# Patient Record
Sex: Male | Born: 1980 | Race: White | Hispanic: No | Marital: Married | Smoking: Former smoker
Health system: Southern US, Community
[De-identification: ages and names within clinical notes are randomized; demographics above are authoritative.]

---

## 2004-03-13 ENCOUNTER — Emergency Department (HOSPITAL_COMMUNITY): Admission: EM | Admit: 2004-03-13 | Discharge: 2004-03-13 | Payer: Self-pay

## 2008-08-12 ENCOUNTER — Emergency Department (HOSPITAL_COMMUNITY): Admission: EM | Admit: 2008-08-12 | Discharge: 2008-08-12 | Payer: Self-pay | Admitting: Family Medicine

## 2008-11-14 ENCOUNTER — Emergency Department (HOSPITAL_COMMUNITY): Admission: EM | Admit: 2008-11-14 | Discharge: 2008-11-14 | Payer: Self-pay | Admitting: Emergency Medicine

## 2010-02-13 ENCOUNTER — Emergency Department (HOSPITAL_COMMUNITY): Admission: EM | Admit: 2010-02-13 | Discharge: 2010-02-13 | Payer: Self-pay | Admitting: Family Medicine

## 2014-06-20 ENCOUNTER — Ambulatory Visit (INDEPENDENT_AMBULATORY_CARE_PROVIDER_SITE_OTHER): Payer: PRIVATE HEALTH INSURANCE | Admitting: Internal Medicine

## 2014-06-20 VITALS — BP 100/64 | HR 59 | Temp 98.3°F | Resp 16 | Ht 77.0 in | Wt 197.4 lb

## 2014-06-20 DIAGNOSIS — N528 Other male erectile dysfunction: Secondary | ICD-10-CM

## 2014-06-20 DIAGNOSIS — N529 Male erectile dysfunction, unspecified: Secondary | ICD-10-CM

## 2014-06-20 MED ORDER — TADALAFIL 20 MG PO TABS: 10.0000 mg | ORAL_TABLET | ORAL | Status: AC | PRN

## 2014-06-20 NOTE — Progress Notes (Signed)
   Subjective:  This chart was scribed for Ellamae Siaobert Damean Poffenberger, MD by Bronson CurbJacqueline Melvin, ED Scribe. This patient was seen in room Room/bed 11 and the patient's care was started at 10:37 AM.     Patient ID: Thomas Hendrix, male    DOB: 31-Dec-1980, 33 y.o.   MRN: [id number]  HPI  HPI Comments: Thomas Hendrix is a 33 y.o. male who presents to the [facility] for a Cialis or Viagra prescription. He states he and his wife are trying to conceive, and is concerned for his performance.50% time =no trouble but now trying to get preg again and Needs to be sure he can perform. He is established with a PCP, but states he can not be seen for 2 months. Dr Renne CriglerPharr.Eval negative re testos and other inhibiting problems. He feels pressure to accomplish. Patient has one child (33 years old).   No med probs No meds  PCP: Dr. Demetrius CharityP   Review of Systems  Constitutional: Negative for fever, chills, activity change, fatigue and unexpected weight change.  Respiratory: Negative for shortness of breath.   Cardiovascular: Negative for chest pain and palpitations.  Genitourinary: Negative for frequency, decreased urine volume, scrotal swelling, difficulty urinating and testicular pain.  all others neg     Objective:   Physical Exam  Nursing note and vitals reviewed. Constitutional: He is oriented to person, place, and time. He appears well-developed and well-nourished. No distress.  HENT:  Head: Normocephalic and atraumatic.  Eyes: Conjunctivae and EOM are normal. Pupils are equal, round, and reactive to light.  Neck: Neck supple. No thyromegaly present.  Cardiovascular: Normal rate and regular rhythm.   Pulmonary/Chest: Effort normal. No respiratory distress.  Musculoskeletal: Normal range of motion.  Neurological: He is alert and oriented to person, place, and time.  Skin: Skin is warm and dry.  Psychiatric: He has a normal mood and affect. His behavior is normal. Thought content normal.            Assessment & Plan:    I have completed the patient encounter in its entirety as documented by the scribe, with editing by me where necessary. Sakeena Teall P. Merla Richesoolittle, M.D. ED--Overthinking cialis ok Meds ordered this encounter  Medications  . tadalafil (CIALIS) 20 MG tablet    Sig: Take 0.5-1 tablets (10-20 mg total) by mouth every other day as needed for erectile dysfunction.    Dispense:  8 tablet    Refill:  11   F/u pcp

## 2016-05-31 ENCOUNTER — Other Ambulatory Visit: Payer: Self-pay | Admitting: Internal Medicine

## 2016-05-31 ENCOUNTER — Ambulatory Visit
Admission: RE | Admit: 2016-05-31 | Discharge: 2016-05-31 | Disposition: A | Discharge status: Home / Self Care | Payer: 59 | Source: Ambulatory Visit | Attending: Internal Medicine | Admitting: Internal Medicine

## 2016-05-31 DIAGNOSIS — R748 Abnormal levels of other serum enzymes: Secondary | ICD-10-CM

## 2016-05-31 DIAGNOSIS — R7989 Other specified abnormal findings of blood chemistry: Secondary | ICD-10-CM

## 2016-05-31 DIAGNOSIS — R109 Unspecified abdominal pain: Secondary | ICD-10-CM

## 2016-05-31 DIAGNOSIS — R945 Abnormal results of liver function studies: Principal | ICD-10-CM

## 2016-06-01 ENCOUNTER — Other Ambulatory Visit: Payer: Self-pay | Admitting: Internal Medicine

## 2016-06-01 ENCOUNTER — Ambulatory Visit
Admission: RE | Admit: 2016-06-01 | Discharge: 2016-06-01 | Disposition: A | Discharge status: Home / Self Care | Payer: 59 | Source: Ambulatory Visit | Attending: Internal Medicine | Admitting: Internal Medicine

## 2016-06-01 DIAGNOSIS — R109 Unspecified abdominal pain: Secondary | ICD-10-CM

## 2016-06-01 DIAGNOSIS — R319 Hematuria, unspecified: Secondary | ICD-10-CM

## 2016-06-01 DIAGNOSIS — M545 Low back pain: Secondary | ICD-10-CM

## 2016-06-01 MED ORDER — IOPAMIDOL (ISOVUE-300) INJECTION 61%: 125.0000 mL | Freq: Once | INTRAVENOUS | Status: DC | PRN

## 2016-06-16 ENCOUNTER — Other Ambulatory Visit (HOSPITAL_COMMUNITY): Payer: Self-pay | Admitting: Gastroenterology

## 2016-06-16 DIAGNOSIS — B1920 Unspecified viral hepatitis C without hepatic coma: Secondary | ICD-10-CM

## 2016-08-03 ENCOUNTER — Ambulatory Visit (HOSPITAL_COMMUNITY)
Admission: RE | Admit: 2016-08-03 | Discharge: 2016-08-03 | Disposition: A | Discharge status: Home / Self Care | Payer: 59 | Source: Ambulatory Visit | Attending: Gastroenterology | Admitting: Gastroenterology

## 2016-08-03 DIAGNOSIS — B1920 Unspecified viral hepatitis C without hepatic coma: Secondary | ICD-10-CM | POA: Insufficient documentation

## 2017-08-20 IMAGING — US US ABDOMEN COMPLETE W/ ELASTOGRAPHY
1 series · 13 of 25 positions shown · non-contrast
Comparison: 06/01/2016 CT abdomen/ pelvis. 05/31/2016 right upper
quadrant abdominal sonogram.

CLINICAL DATA: Hepatitis-C.  Elevated liver function tests.

EXAM:
ABDOMEN ULTRASOUND COMPLETE
ULTRASOUND HEPATIC ELASTOGRAPHY
TECHNIQUE: Ultrasound elastography evaluation of the liver was performed. A
region of interest was placed in the right lobe of the liver.
Following application of a compressive sonographic pulse, shear
waves were detected in the adjacent hepatic tissue and the shear
wave velocity was calculated. Multiple assessments were performed at
the selected site. Median shear wave velocity is correlated to a
Metavir fibrosis score.

[Series 1: us abdomen complete w/ elastography · 0.27mm/px · 13 of 75 slices shown]
[im 1/75]
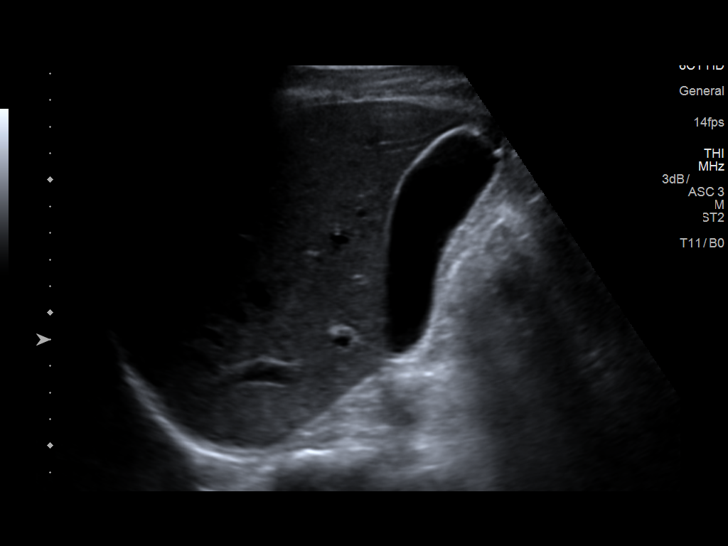
[im 7/75]
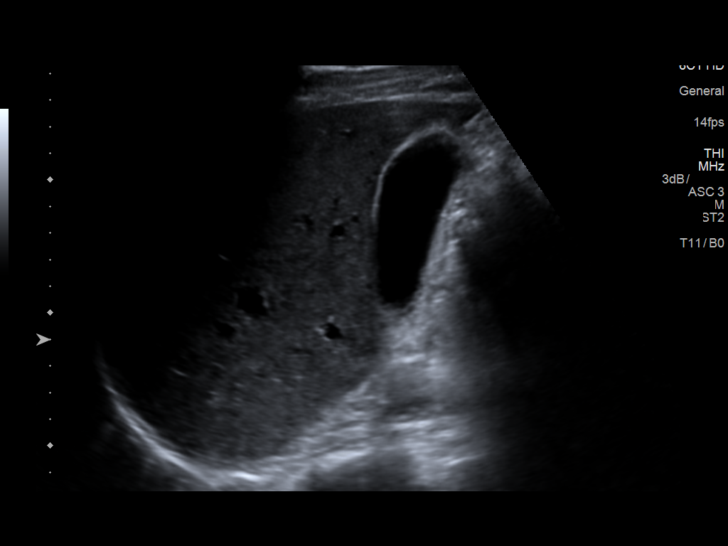
[im 13/75]
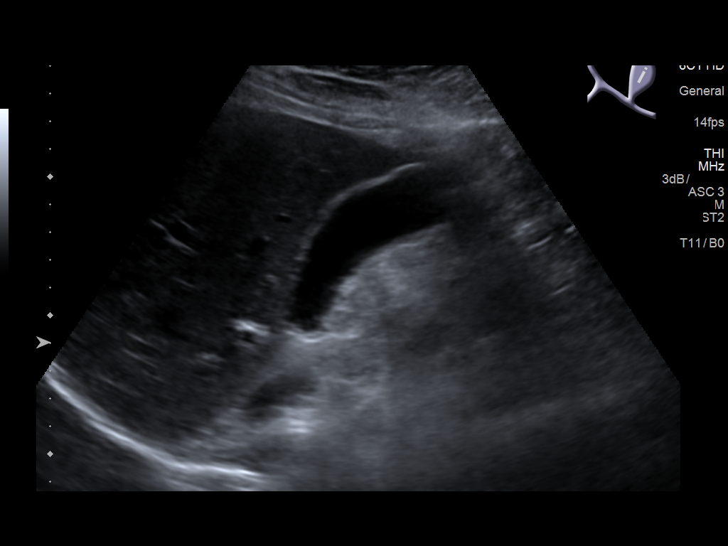
[im 19/75]
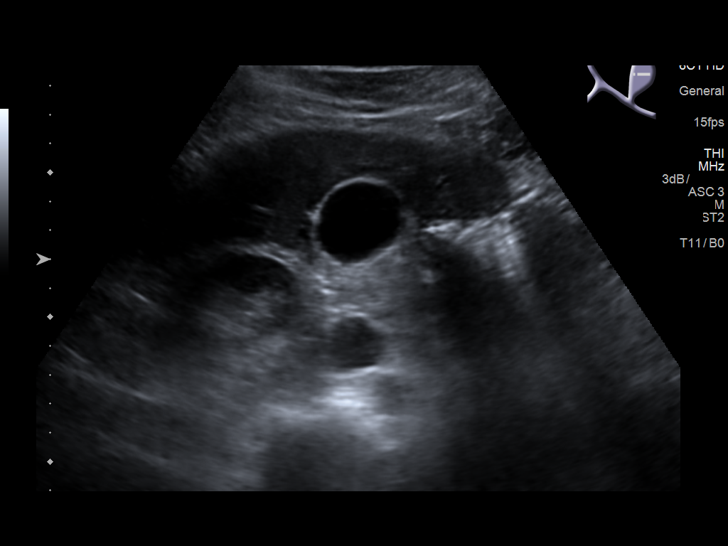
[im 25/75]
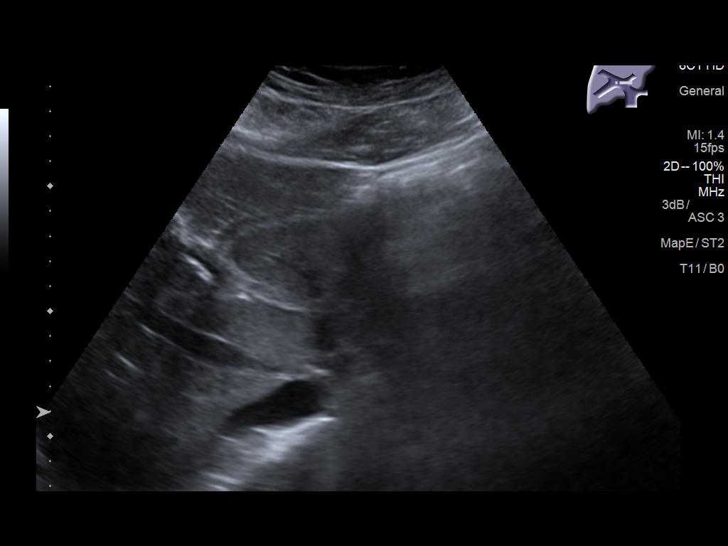
[im 31/75]
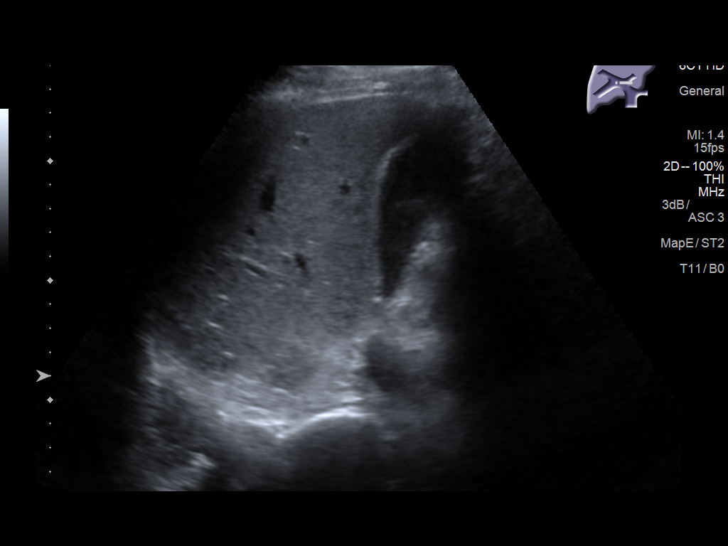
[im 38/75]
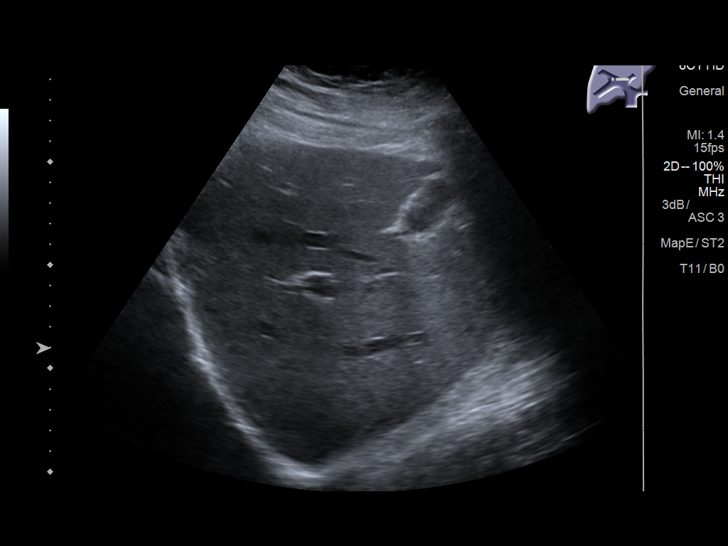
[im 44/75]
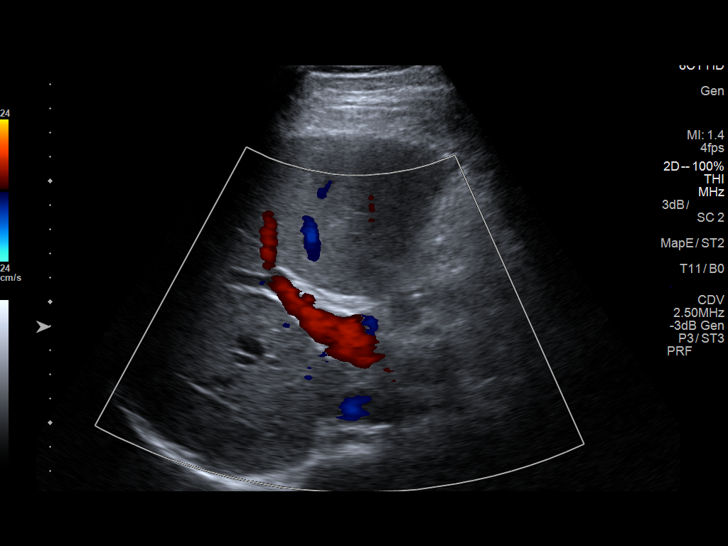
[im 50/75]
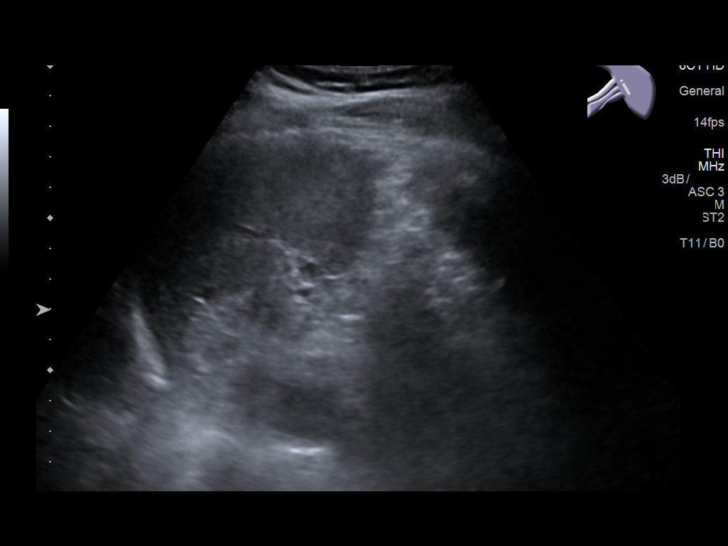
[im 56/75]
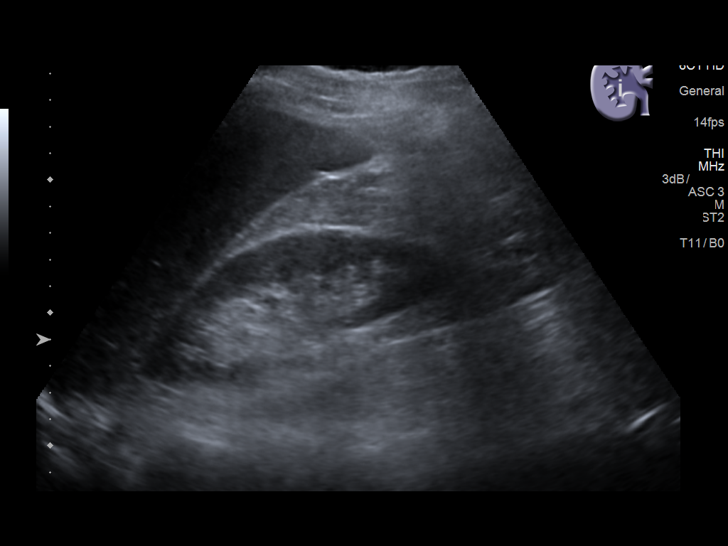
[im 62/75]
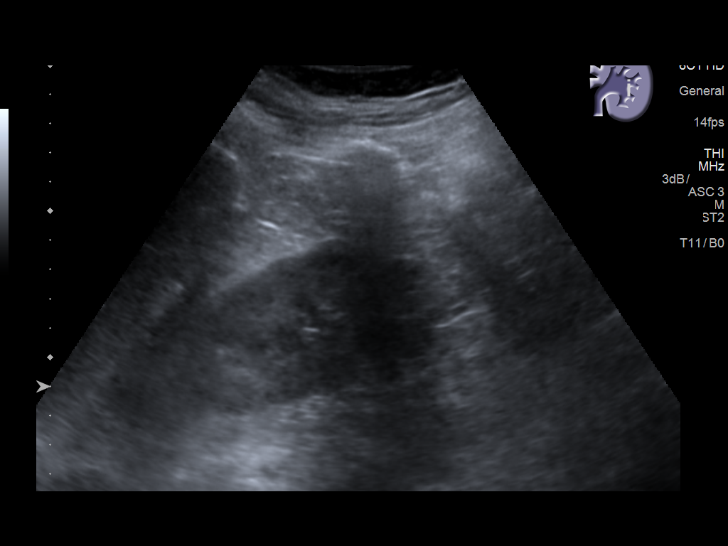
[im 68/75]
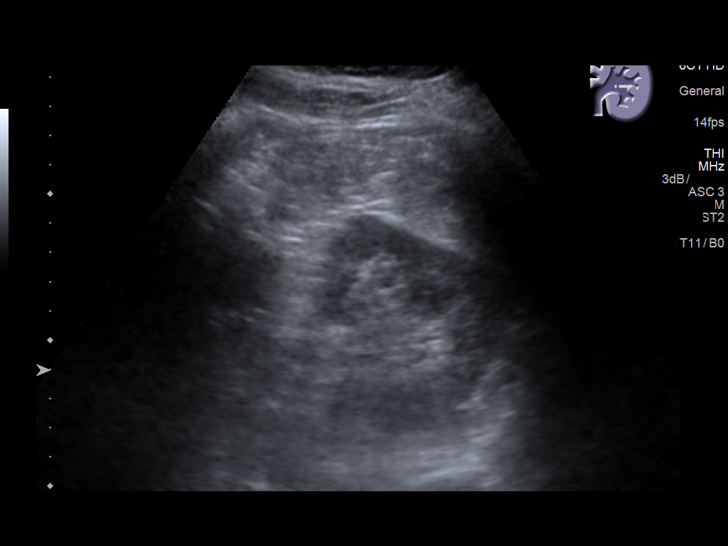
[im 75/75]
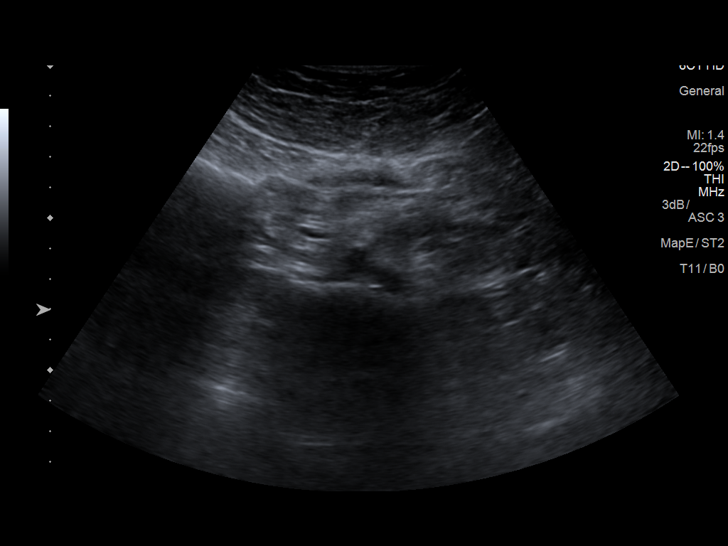

[13 of 25 positions shown; findings below may reference images not displayed]

FINDINGS: Gallbladder: No gallstones or wall thickening visualized. No
sonographic Murphy sign noted by sonographer.

Common bile duct: Diameter: 4 mm

Liver: No focal lesion identified. Within normal limits in
parenchymal echogenicity.

IVC: No abnormality visualized.

Pancreas: Visualized portion unremarkable.

Spleen: Size and appearance within normal limits.

Right Kidney: Length: 12.1 cm. Echogenicity within normal limits. No
mass or hydronephrosis visualized.

Left Kidney: Length: 12.2 cm. Echogenicity within normal limits. No
mass or hydronephrosis visualized.

Abdominal aorta: No aneurysm visualized.

Other findings: None.

Device: Siemens Helix VTQ

Patient position: Supine

Transducer [id number]

Number of measurements: 10

Hepatic segment:  8

Median velocity:   0.99  m/sec

IQR:

IQR/Median velocity ratio:

Corresponding Metavir fibrosis score:  F0/F1

Risk of fibrosis: Minimal

Limitations of exam: None

Pertinent findings noted on other imaging exams:  None

Please note that abnormal shear wave velocities may also be
identified in clinical settings other than with hepatic fibrosis,
such as: acute hepatitis, elevated right heart and central venous
pressures including use of beta blockers, More disease
(Lachapelle), infiltrative processes such as
mastocytosis/amyloidosis/infiltrative tumor, extrahepatic
cholestasis, in the post-prandial state, and liver transplantation.
Correlation with patient history, laboratory data, and clinical
condition recommended.
IMPRESSION: 1. Normal abdominal sonogram. No macroscopic evidence of cirrhosis.
No liver mass. No secondary findings of portal hypertension.
2. Hepatic elastography results:

Median hepatic shear wave velocity is calculated at 0.99 m/sec.

Corresponding Metavir fibrosis score is  F0/F1.

Risk of fibrosis is Minimal.

Follow-up: None required

## 2017-12-16 IMAGING — US US ABDOMEN LIMITED
1 series · 14 of 25 positions shown · non-contrast
Comparison: None.

CLINICAL DATA: Elevated liver function tests.

EXAM:
US ABDOMEN LIMITED - RIGHT UPPER QUADRANT

[Series 1: us abdomen limited · 0.24mm/px · 14 of 53 slices shown]
[im 1/53]
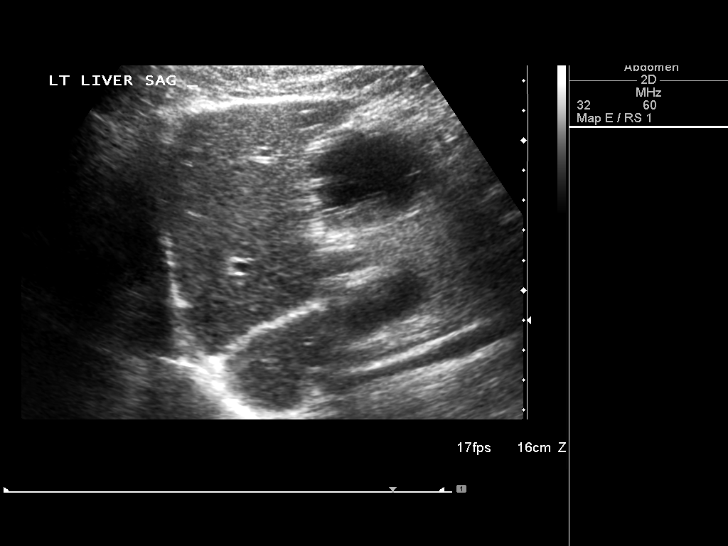
[im 5/53]
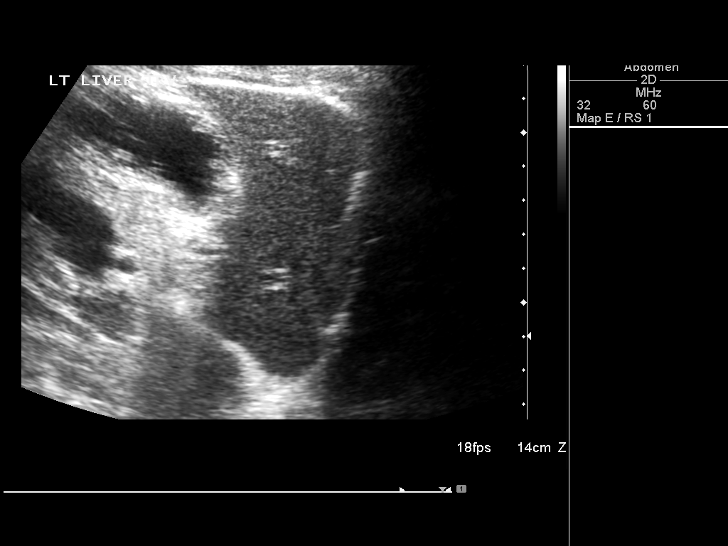
[im 9/53]
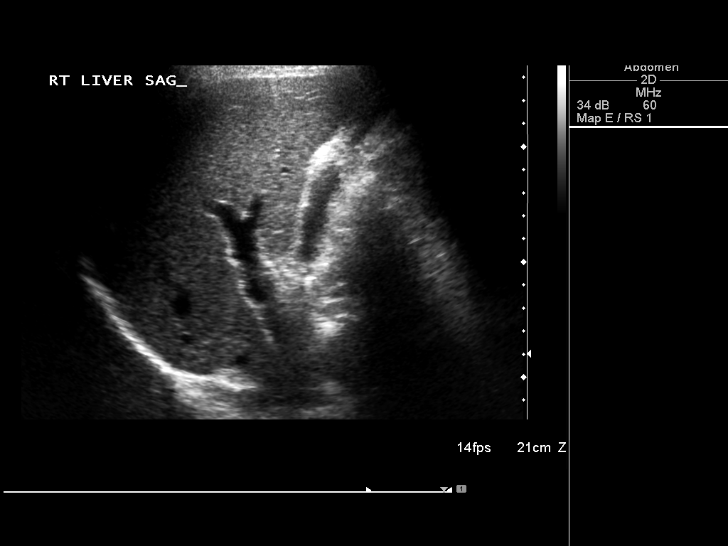
[im 14/53]
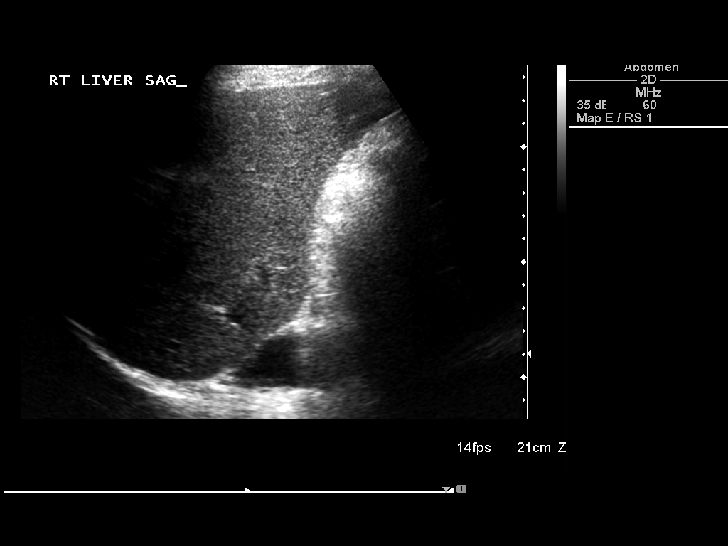
[im 18/53]
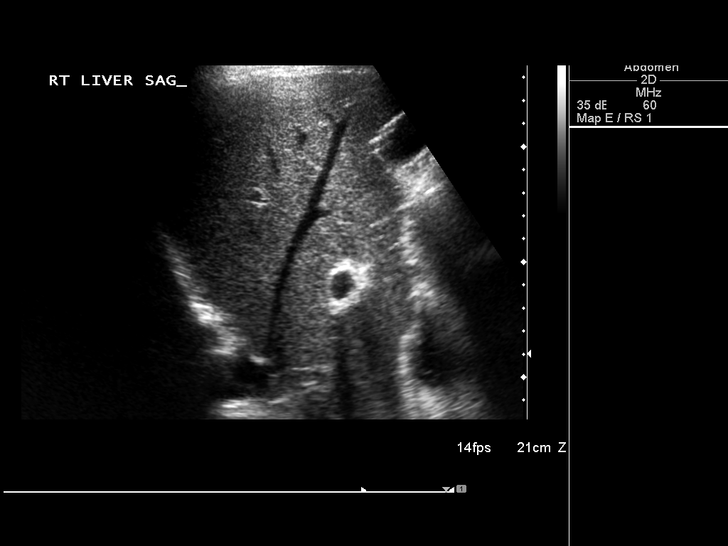
[im 20/53]
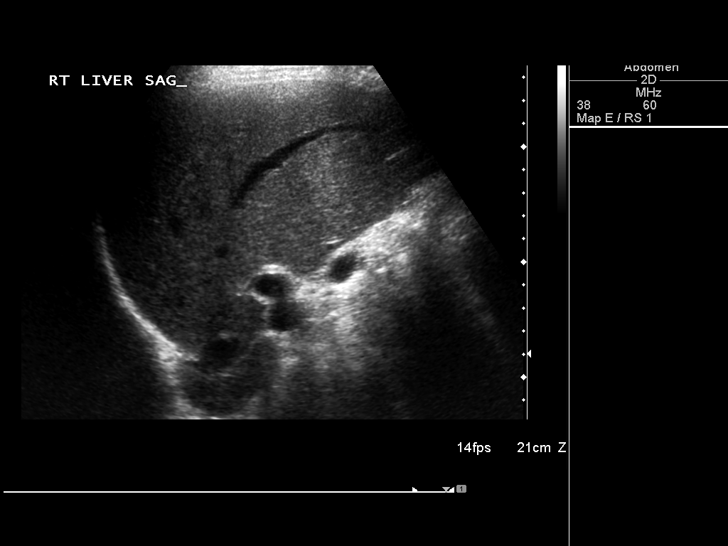
[im 24/53]
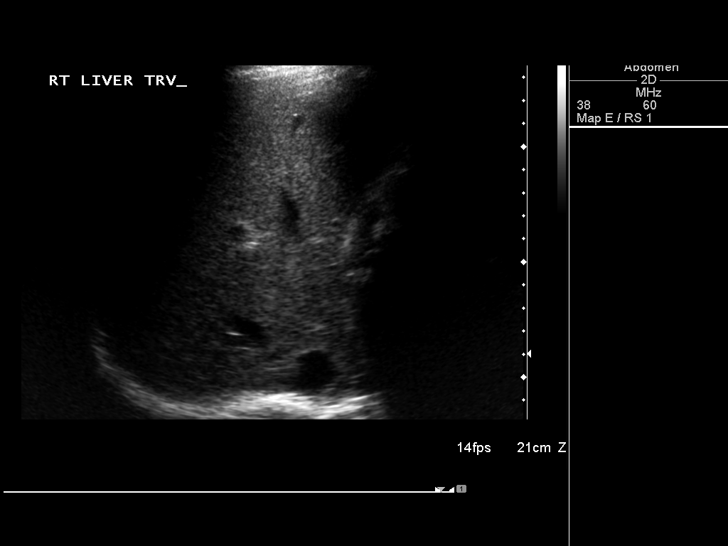
[im 29/53]
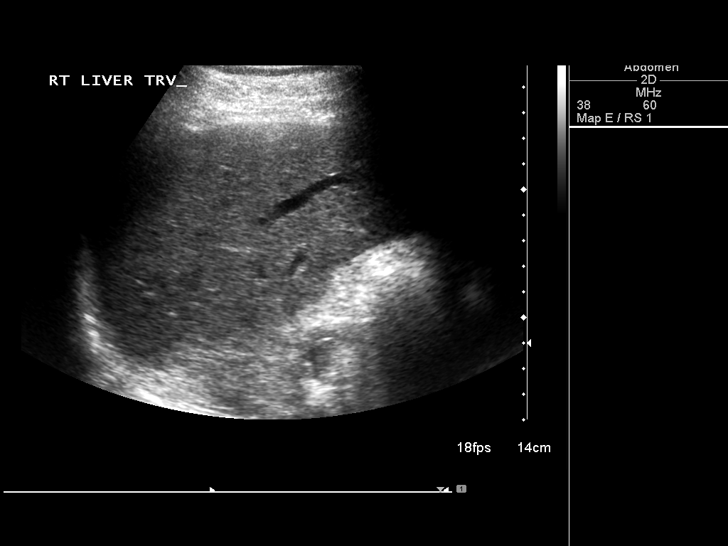
[im 33/53]
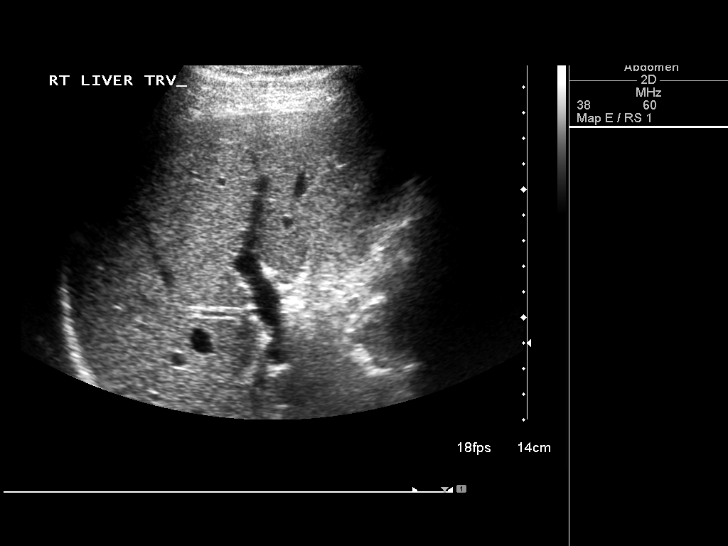
[im 35/53]
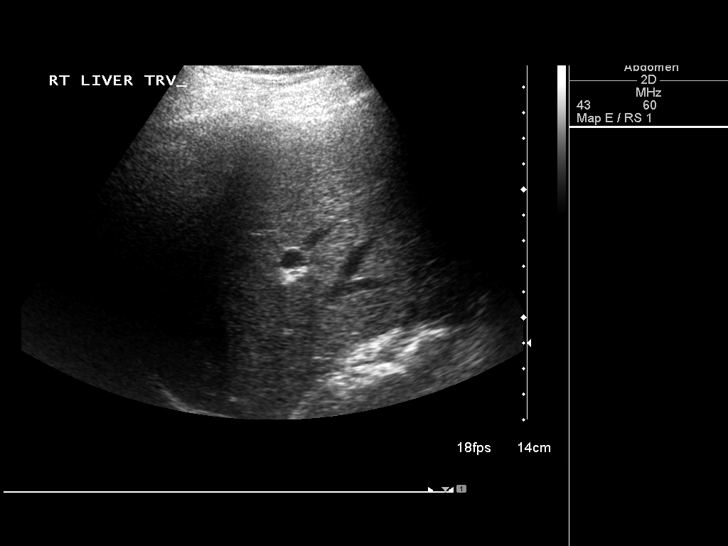
[im 40/53]
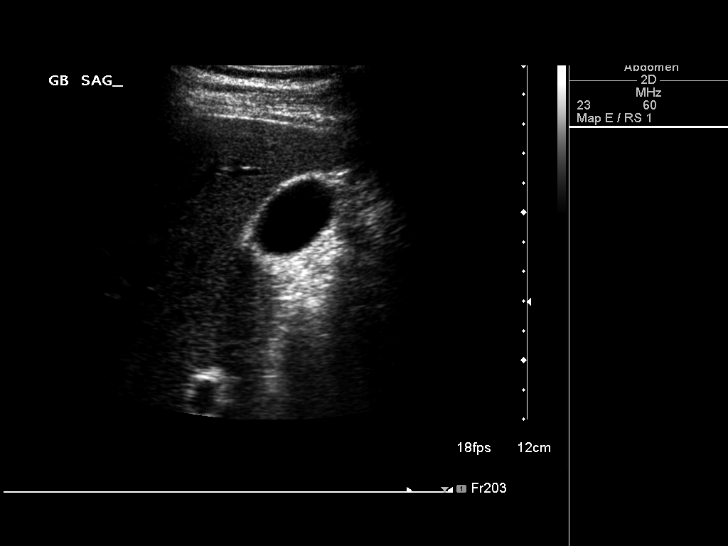
[im 44/53]
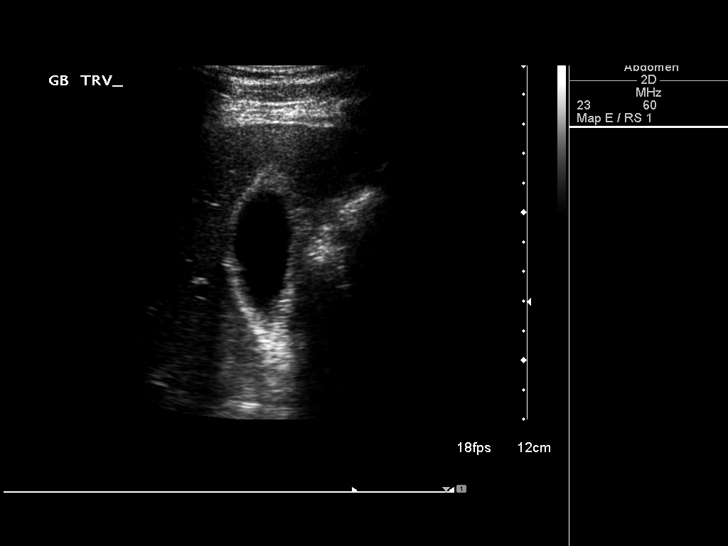
[im 48/53]
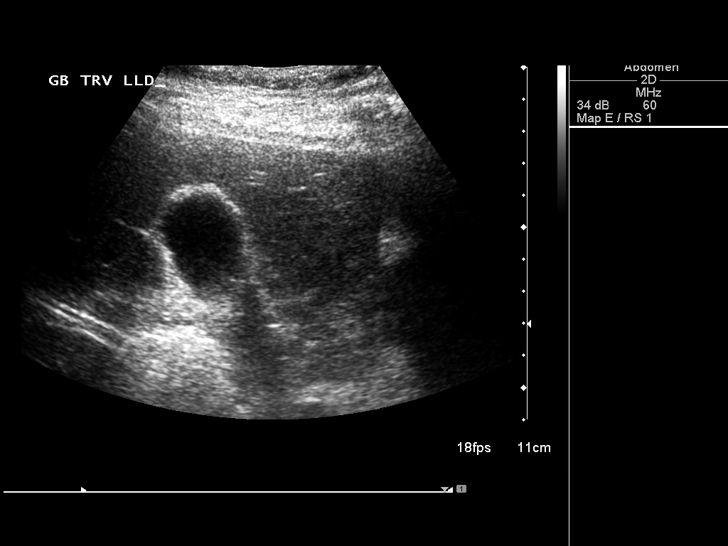
[im 53/53]
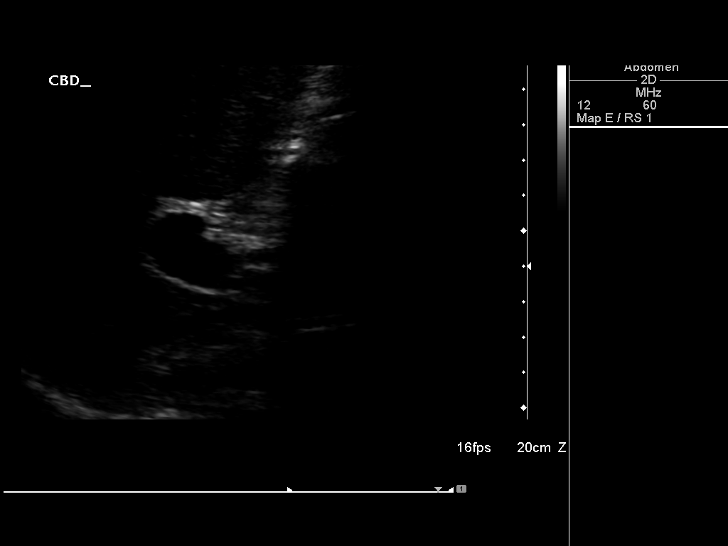

[14 of 25 positions shown; findings below may reference images not displayed]

FINDINGS: Gallbladder:

No gallstones or wall thickening visualized. No sonographic Murphy
sign noted by sonographer.

Common bile duct:

Diameter: 2.8 mm which is within normal limits.

Liver:

No focal lesion identified. Within normal limits in parenchymal
echogenicity.
IMPRESSION: No abnormality seen in the right upper quadrant of the abdomen.

## 2017-12-17 IMAGING — CT CT ABD-PELV W/ CM
2 of 4 series · 10 of 36 positions shown, 17 images · IV contrast (OMNI 300/WATER & [ID] ISOVUE 300)
Comparison: No prior CT.

CLINICAL DATA: 35-year-old presenting with 3 day history of
bilateral lower quadrant abdominal pain, back pain, diarrhea, fever
and chills. Patient is currently undergoing antibiotic therapy.

EXAM:
CT ABDOMEN AND PELVIS WITH CONTRAST
TECHNIQUE: Multidetector CT imaging of the abdomen and pelvis was performed
using the standard protocol following bolus administration of
intravenous contrast.
CONTRAST:  125 ml Isovue 300 IV. Oral contrast was also
administered.

[Series 3: abd/pelvis with · axial · 0.79mm/px · z∈[-418,-58]mm · 9 of 91 slices shown, 15 images]
[im 10/91  soft-tissue]
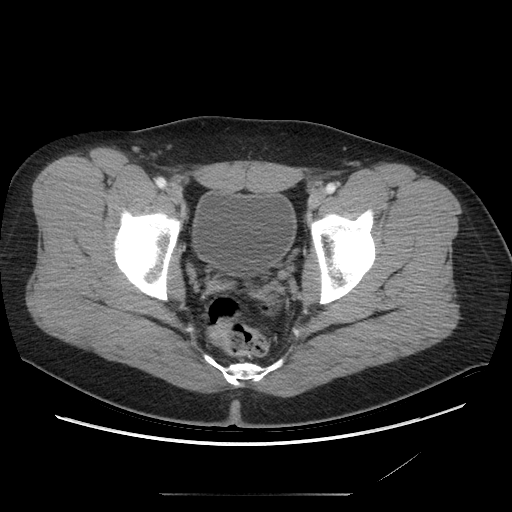
[im 10/91  bone]
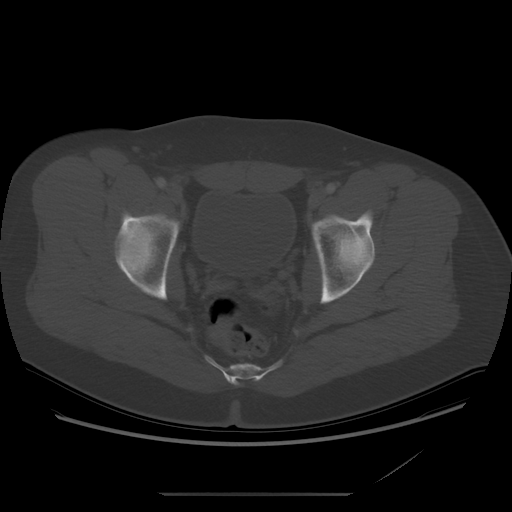
[im 19/91  soft-tissue]
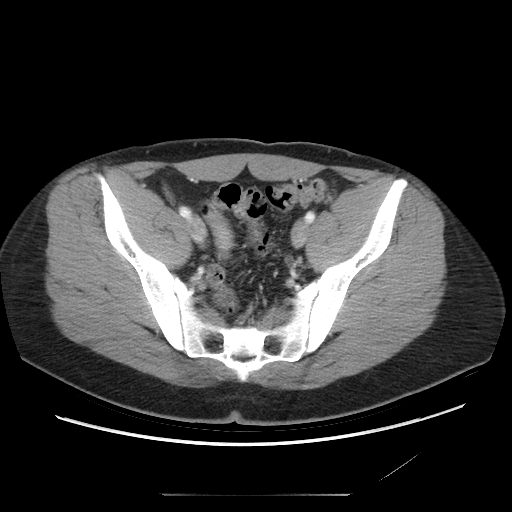
[im 28/91  soft-tissue]
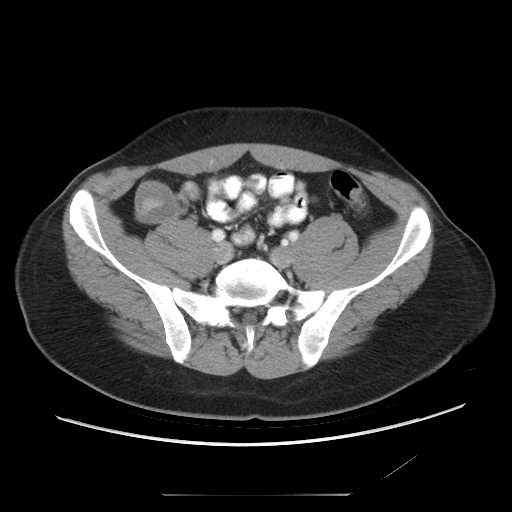
[im 37/91  soft-tissue]
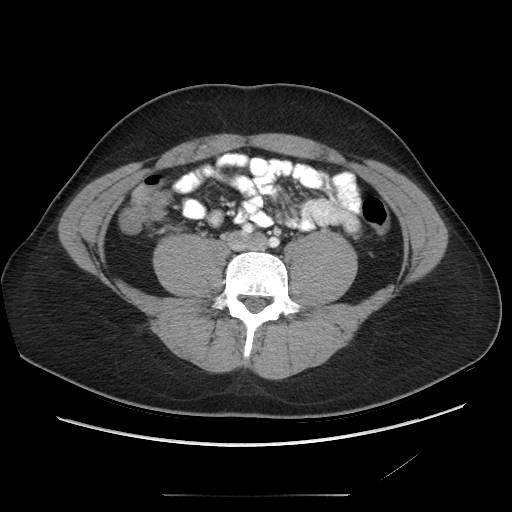
[im 46/91  soft-tissue]
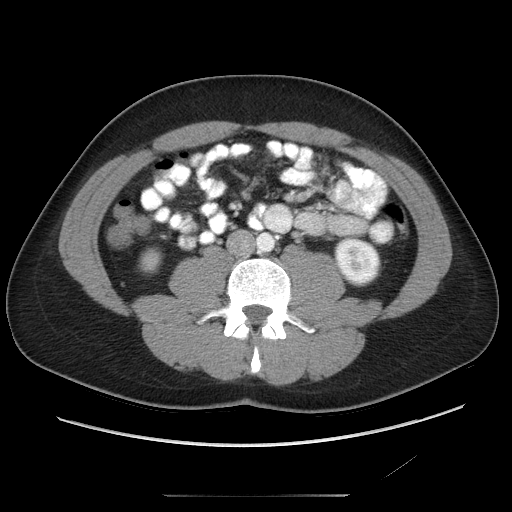
[im 55/91  soft-tissue]
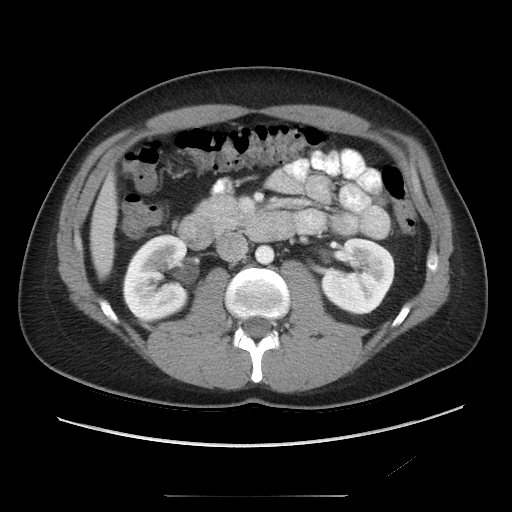
[im 55/91  lung]
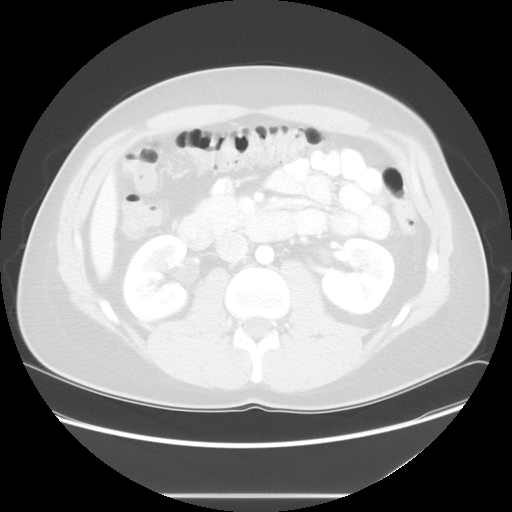
[im 64/91  soft-tissue]
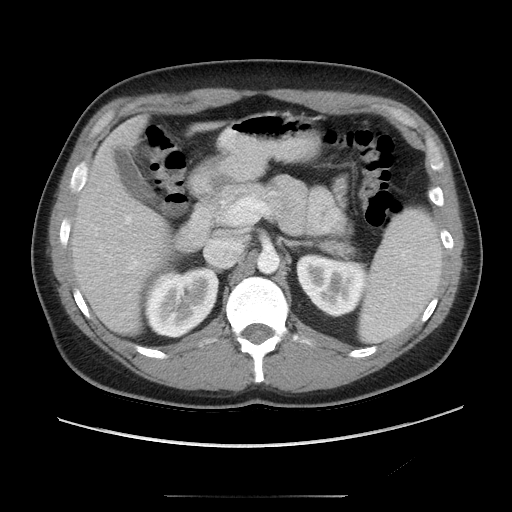
[im 64/91  lung]
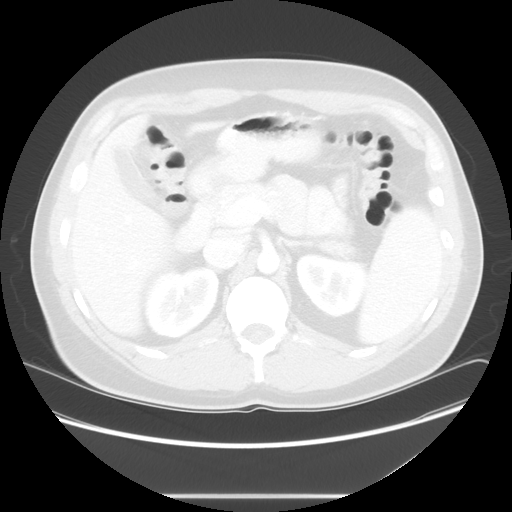
[im 73/91  soft-tissue]
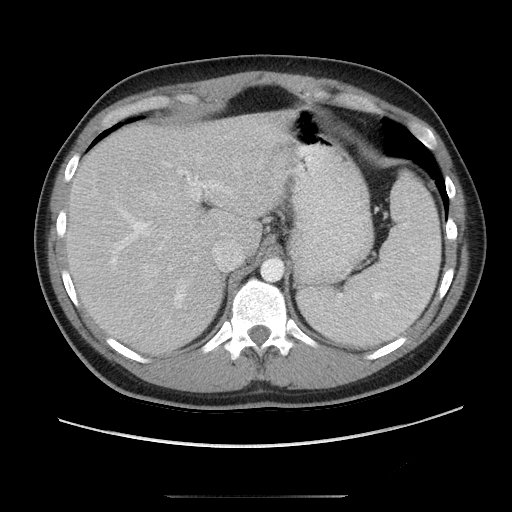
[im 73/91  lung]
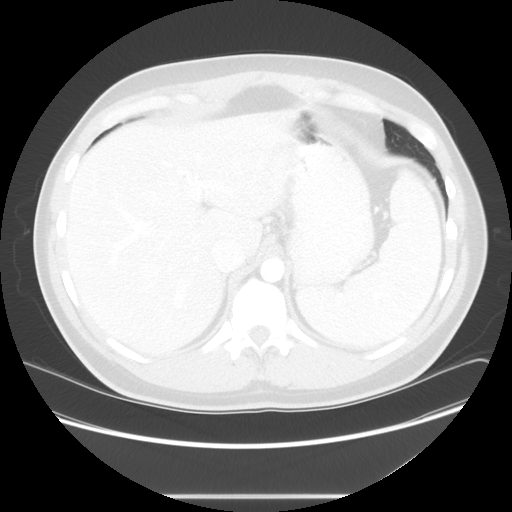
[im 82/91  soft-tissue]
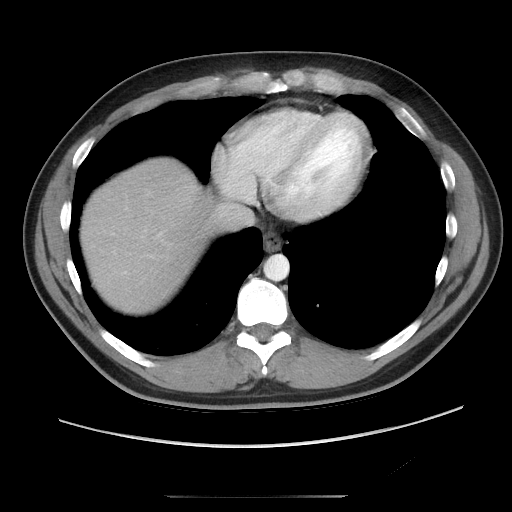
[im 82/91  lung]
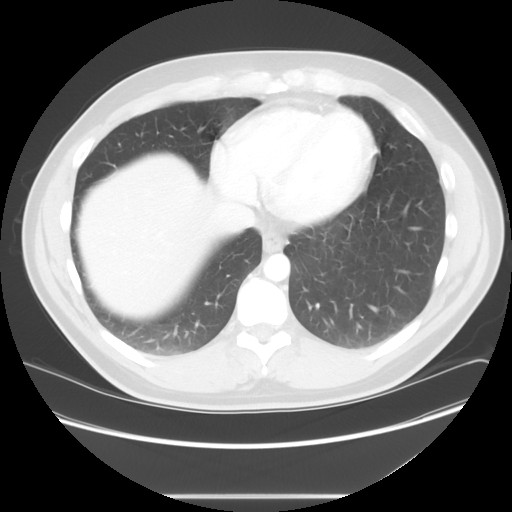
[im 82/91  bone]
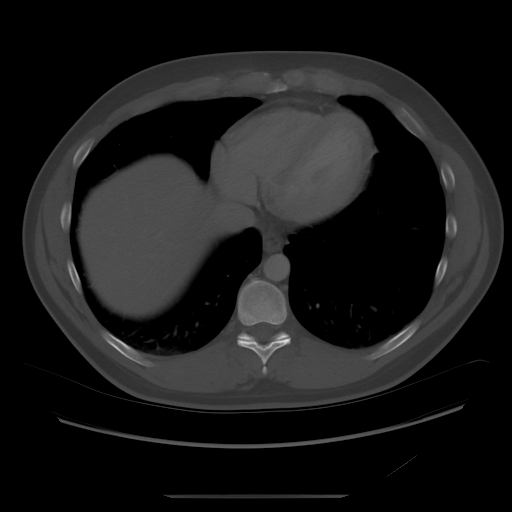

[Series 601: coronal body · coronal · 0.94mm/px · 1 of 124 slices shown, 2 images]
[im 42/124  soft-tissue]
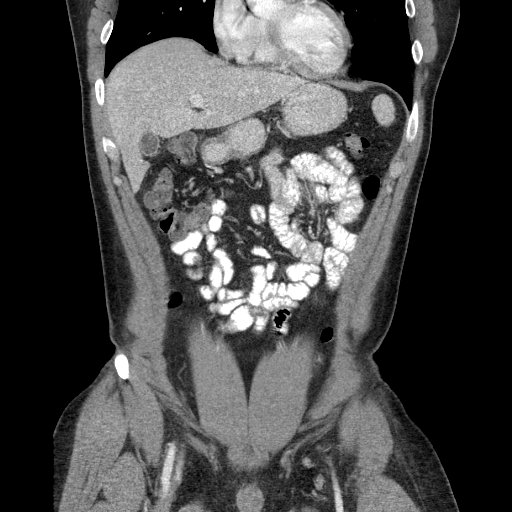
[im 42/124  bone]
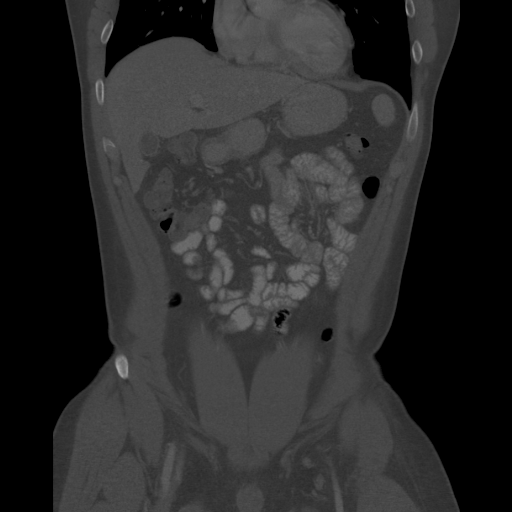

[10 of 36 positions shown; findings below may reference images not displayed]

Right upper quadrant abdominal ultrasound
an urinary tract ultrasound performed yesterday.
FINDINGS: Lower chest:  Heart size normal.  Visualized lung bases clear.

Hepatobiliary: Multiple small low-attenuation lesions in the liver,
the largest approximating 8 mm in the left lobe (segment 3),
indicating benign cysts. No significant focal parenchymal
abnormality involving the liver. Gallbladder normal in appearance
without calcified gallstones. No biliary ductal dilation.

Pancreas: Normal in appearance without evidence of mass, ductal
dilation, or inflammation.

Spleen: Mild splenomegaly, the spleen measuring approximately 14.7 x
5.5 x 12.4 cm, yielding a volume of approximately 500 ml. No focal
splenic parenchymal abnormality. Focus of accessory splenic tissue
lateral to the anterior spleen just below the diaphragm.

Adrenals/Urinary Tract: Normal appearing adrenal glands. Kidneys
normal in size and appearance without focal parenchymal abnormality.
No evidence of urinary tract calculi or obstruction.
Normal-appearing urinary bladder.

Stomach/Bowel: Stomach normal in appearance for the degree of
distention. Normal-appearing small bowel. Lipoma involving the
ileocecal valve. Liquid stool throughout the relatively decompressed
colon. Descending and sigmoid colon diverticulosis without evidence
of acute diverticulitis. Normal-appearing long appendix in the right
upper pelvis.

Vascular/Lymphatic: No visible aortoiliofemoral atherosclerosis.
Widely patent visceral arteries. Normal-appearing portal venous and
systemic venous systems. No pathologic lymphadenopathy.

Reproductive: Prostate gland and seminal vesicles normal in size and
appearance for age.

Other: Phlebolith low in the left side of the pelvis.

Musculoskeletal: Regional skeleton intact without acute or
significant osseous abnormality.
IMPRESSION: 1. No acute abnormalities involving the abdomen or pelvis.
2. Mild splenomegaly without focal splenic parenchymal abnormality.
3. Descending and sigmoid colon diverticulosis without evidence of
acute diverticulitis.

## 2019-05-31 ENCOUNTER — Other Ambulatory Visit: Payer: Self-pay

## 2019-05-31 DIAGNOSIS — Z20822 Contact with and (suspected) exposure to covid-19: Secondary | ICD-10-CM

## 2019-06-02 LAB — NOVEL CORONAVIRUS, NAA: SARS-CoV-2, NAA: NOT DETECTED

## 2019-06-04 ENCOUNTER — Other Ambulatory Visit: Payer: Self-pay

## 2019-11-25 ENCOUNTER — Other Ambulatory Visit: Payer: Self-pay

## 2019-11-25 DIAGNOSIS — Z20822 Contact with and (suspected) exposure to covid-19: Secondary | ICD-10-CM

## 2019-11-26 LAB — NOVEL CORONAVIRUS, NAA: SARS-CoV-2, NAA: NOT DETECTED

## 2021-01-27 ENCOUNTER — Other Ambulatory Visit: Payer: Self-pay

## 2021-01-27 ENCOUNTER — Emergency Department (HOSPITAL_COMMUNITY)
Admission: EM | Admit: 2021-01-27 | Discharge: 2021-01-27 | Disposition: A | Discharge status: Home / Self Care | Payer: BC Managed Care – PPO | Attending: Emergency Medicine | Admitting: Emergency Medicine

## 2021-01-27 ENCOUNTER — Encounter (HOSPITAL_COMMUNITY): Payer: Self-pay

## 2021-01-27 ENCOUNTER — Emergency Department (HOSPITAL_COMMUNITY): Payer: BC Managed Care – PPO

## 2021-01-27 DIAGNOSIS — N451 Epididymitis: Secondary | ICD-10-CM | POA: Diagnosis not present

## 2021-01-27 DIAGNOSIS — N50811 Right testicular pain: Secondary | ICD-10-CM | POA: Diagnosis present

## 2021-01-27 DIAGNOSIS — Z87891 Personal history of nicotine dependence: Secondary | ICD-10-CM | POA: Diagnosis not present

## 2021-01-27 LAB — URINALYSIS, ROUTINE W REFLEX MICROSCOPIC
Bilirubin Urine: NEGATIVE
Glucose, UA: NEGATIVE mg/dL
Ketones, ur: NEGATIVE mg/dL
Nitrite: NEGATIVE
Protein, ur: NEGATIVE mg/dL
Specific Gravity, Urine: 1.014 (ref 1.005–1.030)
pH: 6 (ref 5.0–8.0)

## 2021-01-27 MED ORDER — LEVOFLOXACIN 500 MG PO TABS
500.0000 mg | ORAL_TABLET | Freq: Once | ORAL | Status: AC
  Administered 2021-01-27: 500 mg via ORAL
  Filled 2021-01-27: qty 1

## 2021-01-27 MED ORDER — LEVOFLOXACIN 500 MG PO TABS: 500.0000 mg | ORAL_TABLET | Freq: Every day | ORAL | 0 refills | Status: AC

## 2021-01-27 NOTE — Discharge Instructions (Signed)
As discussed, you have been diagnosed with epididymitis.  This typically improves with antibiotics, though it may take several days.  If you develop new, or concerning changes return here.  Otherwise follow-up with our urology colleagues as needed.

## 2021-01-27 NOTE — ED Triage Notes (Signed)
Patient arrived with complaints of right testicle pain that started on Saturday, examined today and states his testicle feels harder than normal. No known injury, no noticeable swelling.

## 2021-01-27 NOTE — ED Provider Notes (Signed)
[facility]-EMERGENCY DEPT Provider Note   CSN: [id number] Arrival date & time: 01/27/21  2058     History Chief Complaint  Patient presents with  . Testicle Pain    Jaliel Deavers is a 40 y.o. male.  HPI Patient presents with scrotal pain, no dysuria, no polyuria, no oliguria. Onset was 4 days ago.  Since that time he has had pain persistently, worse today.  Pain is described as exquisite sensitivity with tenderness, primarily about the superior aspect.  Pain is more present with accidental impact from anything. Patient has no history of STD, has had the same sexual partner for 2 years. Today after speaking with a friend who is an emergency department physician he was sent here for evaluation to rule out torsion or other considerations.     History reviewed. No pertinent past medical history.  There are no problems to display for this patient.   History reviewed. No pertinent surgical history.     Family History  Problem Relation Age of Onset  . Osteopenia Mother     Social History   Tobacco Use  . Smoking status: Former Smoker    Quit date: 06/20/2004    Years since quitting: 16.6  Substance Use Topics  . Alcohol use: No  . Drug use: No    Home Medications Prior to Admission medications   Medication Sig Start Date End Date Taking? Authorizing Provider  tadalafil (CIALIS) 20 MG tablet Take 0.5-1 tablets (10-20 mg total) by mouth every other day as needed for erectile dysfunction. 06/20/14   Tonye Pearson, MD    Allergies    Patient has no known allergies.  Review of Systems   Review of Systems  Constitutional:       Per HPI, otherwise negative  HENT:       Per HPI, otherwise negative  Respiratory:       Per HPI, otherwise negative  Cardiovascular:       Per HPI, otherwise negative  Gastrointestinal: Negative for vomiting.  Endocrine:       Negative aside from HPI  Genitourinary:       Neg aside from HPI   Musculoskeletal:        Per HPI, otherwise negative  Skin: Negative.   Neurological: Negative for syncope.    Physical Exam Updated Vital Signs BP 113/77   Pulse 67   Temp 98.4 F (36.9 C) (Oral)   Resp 16   Ht 6' (1.829 m)   Wt 90.7 kg   SpO2 97%   BMI 27.12 kg/m   Physical Exam Vitals and nursing note reviewed.  Constitutional:      General: He is not in acute distress.    Appearance: He is well-developed.  HENT:     Head: Normocephalic and atraumatic.  Eyes:     Conjunctiva/sclera: Conjunctivae normal.  Pulmonary:     Effort: Pulmonary effort is normal. No respiratory distress.     Breath sounds: No stridor.  Abdominal:     General: There is no distension.  Genitourinary:    Penis: Normal.      Testes:        Left: Mass not present.    Skin:    General: Skin is warm and dry.  Neurological:     Mental Status: He is alert and oriented to person, place, and time.     ED Results / Procedures / Treatments   Labs (all labs ordered are listed, but only abnormal results are  displayed) Labs Reviewed  URINALYSIS, ROUTINE W REFLEX MICROSCOPIC    EKG None  Radiology US SCROTUM W/DOPPLER  Result Date: 01/27/2021 CLINICAL DATA:  Right testicular pain EXAM: SCROTAL ULTRASOUND DOPPLER ULTRASOUND OF THE TESTICLES TECHNIQUE: Complete ultrasound examination of the testicles, epididymis, and other scrotal structures was performed. Color and spectral Doppler ultrasound were also utilized to evaluate blood flow to the testicles. COMPARISON:  None. FINDINGS: Right testicle Measurements: 4.4 x 2.4 x 3.3 cm. No mass or microlithiasis visualized. Left testicle Measurements: 4.6 x 2.4 x 2.9 cm. No mass or microlithiasis visualized. Right epididymis: Heterogeneously enlarged appearance of the epididymis, particularly towards the epididymal tail where there is marked hypervascularity on color Doppler assessment. Left epididymis:  Normal in size and appearance. Hydrocele: Small right hydrocele,  possibly reactive. No left hydrocele. Varicocele:  None visualized. Pulsed Doppler interrogation of both testes demonstrates normal low resistance arterial and venous waveforms bilaterally. IMPRESSION: 1. Heterogeneously enlarged appearance of the right epididymis with marked hypervascularity of the epididymal tail on color Doppler assessment. Findings are most compatible with right epididymitis. 2. Small right hydrocele, possibly reactive. 3. No evidence of testicular mass or torsion. Electronically Signed   By: Kreg Shropshire M.D.   On: 01/27/2021 22:16    Procedures Procedures   Medications Ordered in ED Medications - No data to display  ED Course  I have reviewed the triage vital signs and the nursing notes.  Pertinent labs & imaging results that were available during my care of the patient were reviewed by me and considered in my medical decision making (see chart for details).   Repeat exam the patient is calm.  We had a lengthy conversation about today's ultrasound findings, concerning for epididymitis.  No evidence for torsion, no obvious mass.  We discussed implications of this, need for initiation of antibiotics.  Patient amenable to this, and without evidence for torsion, mass, other notable findings, low suspicion for bacteremia, sepsis, patient discharged in stable condition. MDM Rules/Calculators/A&P MDM Number of Diagnoses or Management Options Acute epididymitis: new, needed workup   Amount and/or Complexity of Data Reviewed Clinical lab tests: ordered and reviewed Tests in the radiology section of CPT: reviewed and ordered Tests in the medicine section of CPT: reviewed and ordered Independent visualization of images, tracings, or specimens: yes  Risk of Complications, Morbidity, and/or Mortality Presenting problems: high Diagnostic procedures: high Management options: high  Critical Care Total time providing critical care: < 30 minutes  Patient Progress Patient  progress: stable   Final Clinical Impression(s) / ED Diagnoses Final diagnoses:  Acute epididymitis     Gerhard Munch, MD 01/27/21 2239

## 2022-08-14 IMAGING — US US SCROTUM W/ DOPPLER COMPLETE
1 series · 13 of 25 positions shown · non-contrast
Comparison: None.

CLINICAL DATA: Right testicular pain

EXAM:
SCROTAL ULTRASOUND
DOPPLER ULTRASOUND OF THE TESTICLES
TECHNIQUE: Complete ultrasound examination of the testicles, epididymis, and
other scrotal structures was performed. Color and spectral Doppler
ultrasound were also utilized to evaluate blood flow to the
testicles.

[Series 1: us scrotum w/ doppler complete · 13 of 43 slices shown]
[im 1/43]
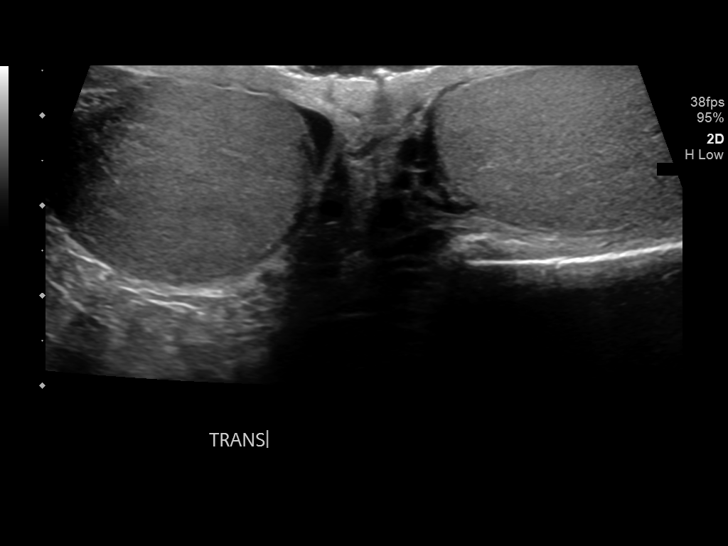
[im 4/43]
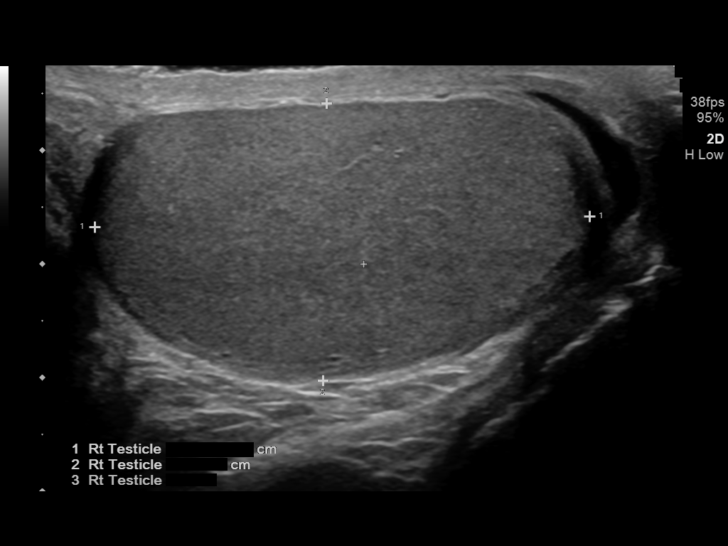
[im 8/43]
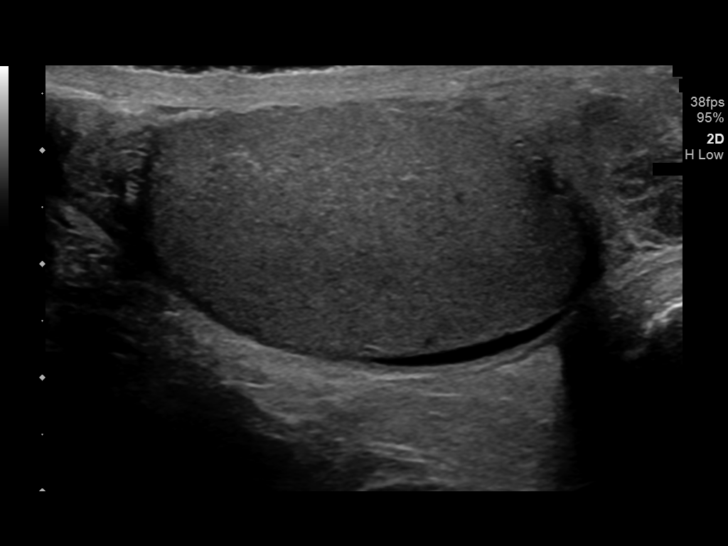
[im 11/43]
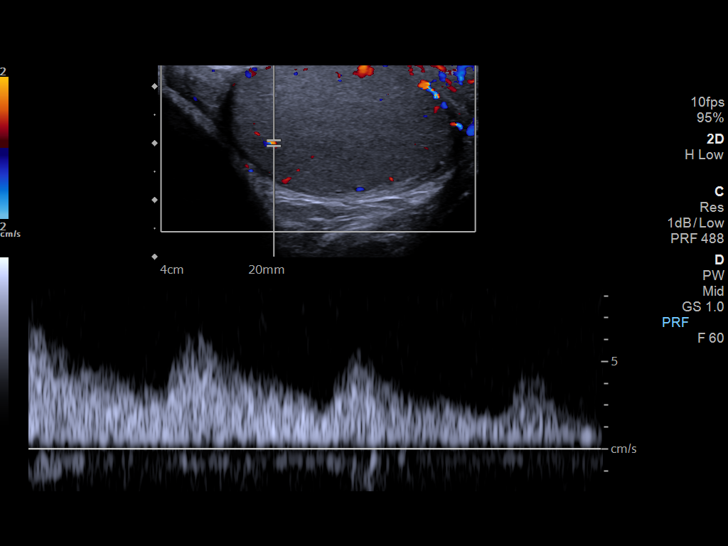
[im 15/43]
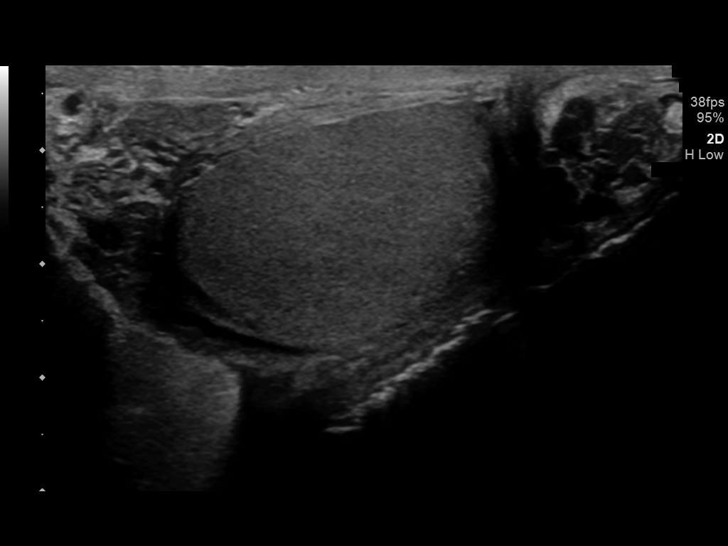
[im 18/43]
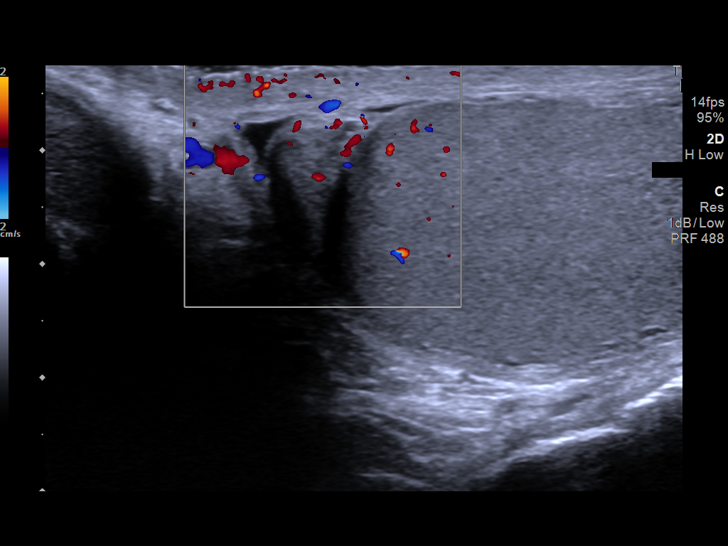
[im 22/43]
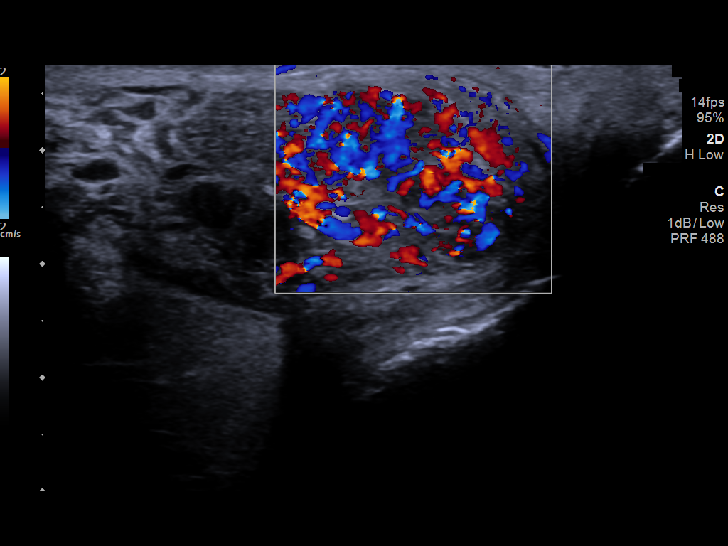
[im 25/43]
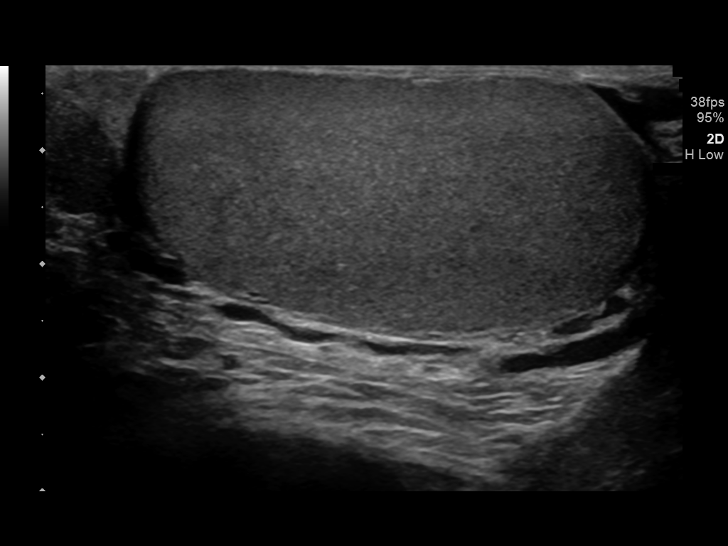
[im 29/43]
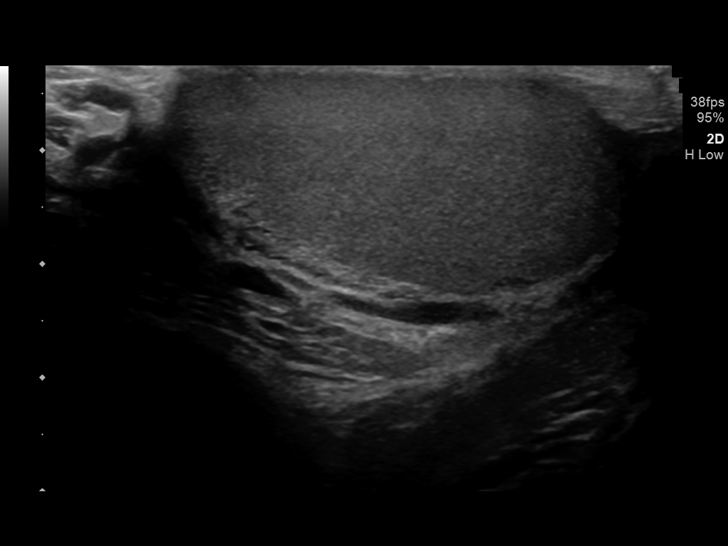
[im 32/43]
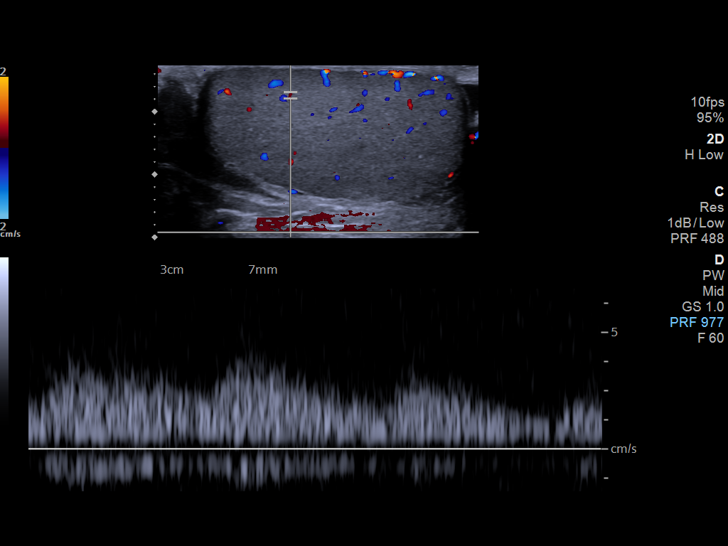
[im 36/43]
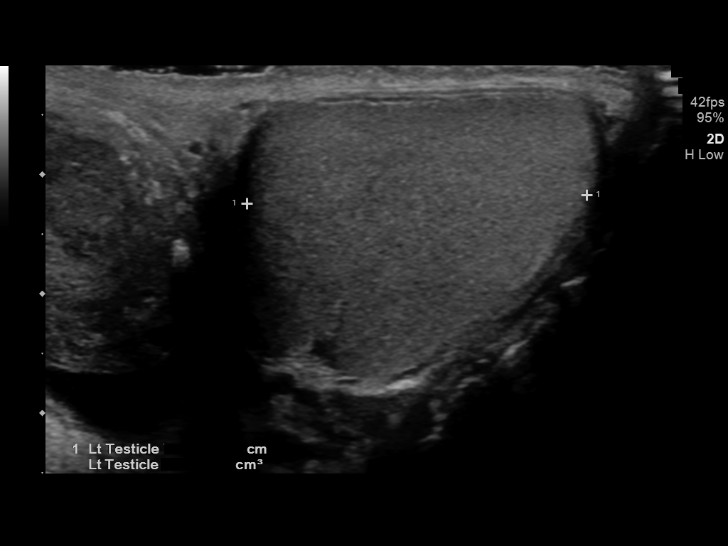
[im 39/43]
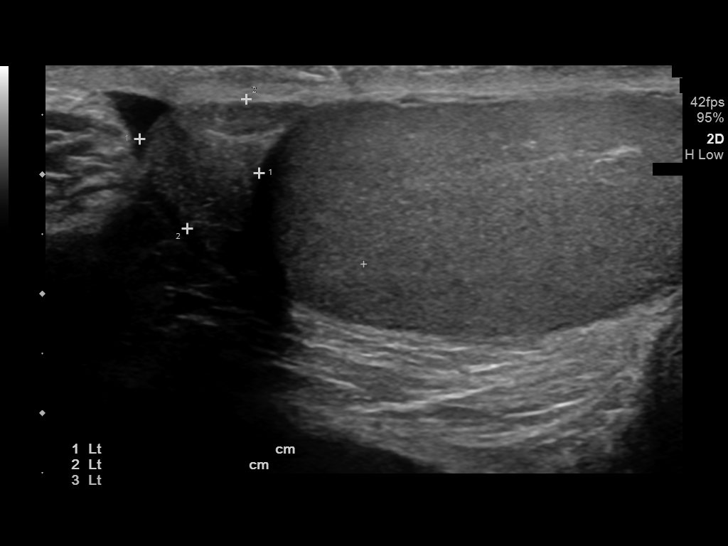
[im 43/43]
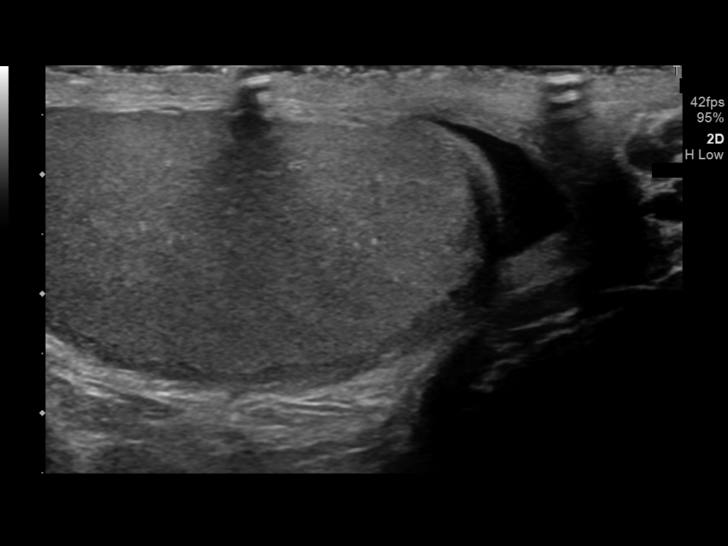

[13 of 25 positions shown; findings below may reference images not displayed]

FINDINGS: Right testicle

Measurements: 4.4 x 2.4 x 3.3 cm. No mass or microlithiasis
visualized.

Left testicle

Measurements: 4.6 x 2.4 x 2.9 cm. No mass or microlithiasis
visualized.

Right epididymis: Heterogeneously enlarged appearance of the
epididymis, particularly towards the epididymal tail where there is
marked hypervascularity on color Doppler assessment.

Left epididymis:  Normal in size and appearance.

Hydrocele: Small right hydrocele, possibly reactive. No left
hydrocele.

Varicocele:  None visualized.

Pulsed Doppler interrogation of both testes demonstrates normal low
resistance arterial and venous waveforms bilaterally.
IMPRESSION: 1. Heterogeneously enlarged appearance of the right epididymis with
marked hypervascularity of the epididymal tail on color Doppler
assessment. Findings are most compatible with right epididymitis.
2. Small right hydrocele, possibly reactive.
3. No evidence of testicular mass or torsion.
